# Patient Record
Sex: Male | Born: 1965 | Race: White | Hispanic: No | Marital: Single | Smoking: Never smoker
Health system: Southern US, Community
[De-identification: ages and names within clinical notes are randomized; demographics above are authoritative.]

## PROBLEM LIST (undated history)

## (undated) DIAGNOSIS — T7840XA Allergy, unspecified, initial encounter: Secondary | ICD-10-CM

## (undated) DIAGNOSIS — K529 Noninfective gastroenteritis and colitis, unspecified: Secondary | ICD-10-CM

## (undated) DIAGNOSIS — R634 Abnormal weight loss: Secondary | ICD-10-CM

## (undated) HISTORY — PX: ANKLE SURGERY: SHX546

## (undated) HISTORY — PX: KNEE SURGERY: SHX244

---

## 2016-05-02 ENCOUNTER — Encounter: Payer: Self-pay | Admitting: Emergency Medicine

## 2016-05-02 ENCOUNTER — Emergency Department
Admission: EM | Admit: 2016-05-02 | Discharge: 2016-05-02 | Disposition: A | Discharge status: Home / Self Care | Payer: BLUE CROSS/BLUE SHIELD | Attending: Emergency Medicine | Admitting: Emergency Medicine

## 2016-05-02 DIAGNOSIS — J0100 Acute maxillary sinusitis, unspecified: Secondary | ICD-10-CM | POA: Insufficient documentation

## 2016-05-02 DIAGNOSIS — H6993 Unspecified Eustachian tube disorder, bilateral: Secondary | ICD-10-CM | POA: Diagnosis not present

## 2016-05-02 DIAGNOSIS — R439 Unspecified disturbances of smell and taste: Secondary | ICD-10-CM | POA: Diagnosis not present

## 2016-05-02 DIAGNOSIS — R432 Parageusia: Secondary | ICD-10-CM

## 2016-05-02 DIAGNOSIS — H6983 Other specified disorders of Eustachian tube, bilateral: Secondary | ICD-10-CM

## 2016-05-02 DIAGNOSIS — H9203 Otalgia, bilateral: Secondary | ICD-10-CM | POA: Diagnosis present

## 2016-05-02 MED ORDER — PREDNISONE 10 MG (21) PO TBPK: ORAL_TABLET | ORAL | 0 refills | Status: AC

## 2016-05-02 MED ORDER — AMOXICILLIN 500 MG PO TABS: 500.0000 mg | ORAL_TABLET | Freq: Three times a day (TID) | ORAL | 0 refills | Status: AC

## 2016-05-02 NOTE — ED Notes (Signed)
First nurse note   Having bilateral ear pain for a couple of weeks  Feels like"ears are clogged"

## 2016-05-02 NOTE — ED Notes (Signed)
AAOx3.  Skin warm and dry.  NAD 

## 2016-05-02 NOTE — ED Provider Notes (Signed)
[facility] Emergency Department Provider Note ____________________________________________  Time seen: Approximately 9:31 AM  I have reviewed the triage vital signs and the nursing notes.   HISTORY  Chief Complaint Otalgia    HPI Angus SellerWilliam D Ware is a 51 y.o. male who presents to the emergency department for evaluation of sinus pressure and decreased hearing in both ears, "bumps" on tongue, and decreased taste sensation. Symptoms have been present for the past 2-3 weeks. He has a PMH of seasonal allergies. He is currently taking Sudafed without relief of current symptoms. He has been using a wood burning fireplace in his home recently, otherwise no new exposures.   History reviewed. No pertinent past medical history.  There are no active problems to display for this patient.   Past Surgical History:  Procedure Laterality Date  . ANKLE SURGERY    . KNEE SURGERY      Prior to Admission medications   Medication Sig Start Date End Date Taking? Authorizing Provider  amoxicillin (AMOXIL) 500 MG tablet Take 1 tablet (500 mg total) by mouth 3 (three) times daily. 05/02/16   Chinita Pesterari B Leonardo Makris, FNP  predniSONE (STERAPRED UNI-PAK 21 TAB) 10 MG (21) TBPK tablet Take 6 tablets on day 1 Take 5 tablets on day 2 Take 4 tablets on day 3 Take 3 tablets on day 4 Take 2 tablets on day 5 Take 1 tablet on day 6 05/02/16   Chinita Pesterari B Kae Lauman, FNP    Allergies Patient has no known allergies.  History reviewed. No pertinent family history.  Social History Social History  Substance Use Topics  . Smoking status: Never Smoker  . Smokeless tobacco: Never Used  . Alcohol use Yes     Comment: rare    Review of Systems Constitutional: Negative for fever/chills Eyes: No visual changes. ENT: Negative for  Earache. Positive for pressure feeling and decreased hearing bilaterally Gastrointestinal: No abdominal pain.  No nausea, no vomiting.  No diarrhea.  No  constipation. Musculoskeletal: Negative for pain. Skin: Negative for rash. Neurological: Negative for headaches, focal weakness or numbness. ____________________________________________   PHYSICAL EXAM:  VITAL SIGNS: ED Triage Vitals [05/02/16 0829]  Enc Vitals Group     BP 126/77     Pulse Rate (!) 59     Resp 16     Temp 98.1 F (36.7 C)     Temp Source Oral     SpO2 99 %     Weight 170 lb (77.1 kg)     Height 6\' 1"  (1.854 m)     Head Circumference      Peak Flow      Pain Score      Pain Loc      Pain Edu?      Excl. in GC?     Constitutional: Alert and oriented. Well appearing and in no acute distress. Eyes: Conjunctivae are normal. PERRL. EOMI. Ears: No pain with movement of either auricle:; External canal patent;  Right TM partially occluded by cerumen, visible TM retracted; Left TM partially occluded by cerumen, visible TM retracted.   Head: Atraumatic. Nose: No congestion/rhinnorhea. Tenderness on light tap over maxillary sinuses. Mouth/Throat: Mucous membranes are moist.  Oropharynx non-erythematous. Papillae enlarged on posterior tongue Hematological/Lymphatic/Immunilogical: No cervical lymphadenopathy. Cardiovascular: Normal capillary refill. Respiratory: Normal respiratory effort.  No retractions.  Neurologic:  Normal speech and language. No gross focal neurologic deficits are appreciated. Speech is normal. No gait instability. Skin:  Skin is warm, dry and intact. No rash  noted. ____________________________________________   LABS (all labs ordered are listed, but only abnormal results are displayed)  Labs Reviewed - No data to display ____________________________________________   RADIOLOGY  Not indicated ____________________________________________   PROCEDURES  Procedure(s) performed: None  ____________________________________________   INITIAL IMPRESSION / ASSESSMENT AND PLAN / ED COURSE  Pertinent labs & imaging results that were  available during my care of the patient were reviewed by me and considered in my medical decision making (see chart for details).    51 year old male presenting for evaluation of decreased hearing, loss of taste, bumps on back of tongue.He will be treated with prednisone and amoxicillin for eustachian tube dysfunction and sinusitis. He was advised to follow up with ENT for symptoms that are not improving over the week. He was advised to return to the ER for symptoms that change or worsen if unable to see primary care or the specialist. ____________________________________________   FINAL CLINICAL IMPRESSION(S) / ED DIAGNOSES  Final diagnoses:  Eustachian tube dysfunction, bilateral  Subacute maxillary sinusitis  Loss of taste    Note:  This document was prepared using Dragon voice recognition software and may include unintentional dictation errors.     Chinita Pester, FNP 05/02/16 1317    Minna Antis, MD 05/02/16 1355

## 2016-05-02 NOTE — Discharge Instructions (Signed)
Take your medications as prescribed and until finished. Follow up with the ENT specialist for symptoms that are not improving over the week. Return to the ER for symptoms that change or worsen if unable to schedule an appointment.

## 2016-05-02 NOTE — ED Triage Notes (Signed)
Pt presents with ear pain, sinus pain, and "bumps on back of tongue" x 2 weeks. Pt states he is having trouble hearing. NAD noted.

## 2017-02-14 ENCOUNTER — Emergency Department (HOSPITAL_COMMUNITY): Payer: BLUE CROSS/BLUE SHIELD

## 2017-02-14 ENCOUNTER — Emergency Department (HOSPITAL_COMMUNITY)
Admission: EM | Admit: 2017-02-14 | Discharge: 2017-02-14 | Disposition: A | Discharge status: Home / Self Care | Payer: BLUE CROSS/BLUE SHIELD | Attending: Emergency Medicine | Admitting: Emergency Medicine

## 2017-02-14 ENCOUNTER — Encounter (HOSPITAL_COMMUNITY): Payer: Self-pay | Admitting: *Deleted

## 2017-02-14 ENCOUNTER — Other Ambulatory Visit: Payer: Self-pay

## 2017-02-14 DIAGNOSIS — K529 Noninfective gastroenteritis and colitis, unspecified: Secondary | ICD-10-CM | POA: Insufficient documentation

## 2017-02-14 DIAGNOSIS — R1030 Lower abdominal pain, unspecified: Secondary | ICD-10-CM | POA: Diagnosis present

## 2017-02-14 LAB — CBC WITH DIFFERENTIAL/PLATELET
BASOS PCT: 0 %
Basophils Absolute: 0 10*3/uL (ref 0.0–0.1)
EOS ABS: 0.4 10*3/uL (ref 0.0–0.7)
EOS PCT: 6 %
HCT: 42.7 % (ref 39.0–52.0)
Hemoglobin: 14.3 g/dL (ref 13.0–17.0)
Lymphocytes Relative: 26 %
Lymphs Abs: 1.5 10*3/uL (ref 0.7–4.0)
MCH: 33.9 pg (ref 26.0–34.0)
MCHC: 33.5 g/dL (ref 30.0–36.0)
MCV: 101.2 fL — ABNORMAL HIGH (ref 78.0–100.0)
MONO ABS: 0.6 10*3/uL (ref 0.1–1.0)
Monocytes Relative: 10 %
Neutro Abs: 3.4 10*3/uL (ref 1.7–7.7)
Neutrophils Relative %: 58 %
Platelets: 178 10*3/uL (ref 150–400)
RBC: 4.22 MIL/uL (ref 4.22–5.81)
RDW: 13.8 % (ref 11.5–15.5)
WBC: 5.9 10*3/uL (ref 4.0–10.5)

## 2017-02-14 LAB — COMPREHENSIVE METABOLIC PANEL
ALBUMIN: 4.1 g/dL (ref 3.5–5.0)
ALT: 23 U/L (ref 17–63)
ANION GAP: 6 (ref 5–15)
AST: 27 U/L (ref 15–41)
Alkaline Phosphatase: 112 U/L (ref 38–126)
BUN: 18 mg/dL (ref 6–20)
CO2: 27 mmol/L (ref 22–32)
Calcium: 8.5 mg/dL — ABNORMAL LOW (ref 8.9–10.3)
Chloride: 103 mmol/L (ref 101–111)
Creatinine, Ser: 0.73 mg/dL (ref 0.61–1.24)
GFR calc non Af Amer: >60 mL/min (ref >60)
GLUCOSE: 92 mg/dL (ref 65–99)
POTASSIUM: 3.7 mmol/L (ref 3.5–5.1)
SODIUM: 136 mmol/L (ref 135–145)
TOTAL PROTEIN: 7.3 g/dL (ref 6.5–8.1)
Total Bilirubin: 0.5 mg/dL (ref 0.3–1.2)

## 2017-02-14 LAB — URINALYSIS, ROUTINE W REFLEX MICROSCOPIC
Bilirubin Urine: NEGATIVE
GLUCOSE, UA: NEGATIVE mg/dL
HGB URINE DIPSTICK: NEGATIVE
KETONES UR: NEGATIVE mg/dL
Leukocytes, UA: NEGATIVE
Nitrite: NEGATIVE
PROTEIN: NEGATIVE mg/dL
Specific Gravity, Urine: 1.026 (ref 1.005–1.030)
pH: 6 (ref 5.0–8.0)

## 2017-02-14 MED ORDER — CIPROFLOXACIN IN D5W 400 MG/200ML IV SOLN
400.0000 mg | Freq: Once | INTRAVENOUS | Status: AC
  Administered 2017-02-14: 400 mg via INTRAVENOUS
  Filled 2017-02-14: qty 200

## 2017-02-14 MED ORDER — ONDANSETRON 4 MG PO TBDP: 4.0000 mg | ORAL_TABLET | Freq: Three times a day (TID) | ORAL | 0 refills | Status: AC | PRN

## 2017-02-14 MED ORDER — CIPROFLOXACIN HCL 500 MG PO TABS: 500.0000 mg | ORAL_TABLET | Freq: Two times a day (BID) | ORAL | 0 refills | Status: AC

## 2017-02-14 MED ORDER — METRONIDAZOLE IN NACL 5-0.79 MG/ML-% IV SOLN
500.0000 mg | Freq: Once | INTRAVENOUS | Status: AC
  Administered 2017-02-14: 500 mg via INTRAVENOUS
  Filled 2017-02-14: qty 100

## 2017-02-14 MED ORDER — SODIUM CHLORIDE 0.9 % IV BOLUS (SEPSIS)
1000.0000 mL | Freq: Once | INTRAVENOUS | Status: AC
  Administered 2017-02-14: 1000 mL via INTRAVENOUS

## 2017-02-14 MED ORDER — METRONIDAZOLE 500 MG PO TABS: 500.0000 mg | ORAL_TABLET | Freq: Two times a day (BID) | ORAL | 0 refills | Status: AC

## 2017-02-14 MED ORDER — IOPAMIDOL (ISOVUE-300) INJECTION 61%
100.0000 mL | Freq: Once | INTRAVENOUS | Status: AC | PRN
  Administered 2017-02-14: 100 mL via INTRAVENOUS

## 2017-02-14 NOTE — Discharge Instructions (Signed)
Take the antibiotics as prescribed.  Follow-up with your doctor for recheck.  You should also have a CT scan of your chest to evaluate the lung nodule we found accidentally. this should be evaluated in 3 months to make sure it is not changing.. Return to the ED if you develop new or worsening symptoms.

## 2017-02-14 NOTE — ED Triage Notes (Signed)
Pt c/o lower abdominal pain and lower back pain x 3 days; pt denies any n/v or urinary symptoms

## 2017-02-14 NOTE — ED Provider Notes (Signed)
[facility] EMERGENCY DEPARTMENT Provider Note   CSN: [id number] Arrival date & time: 02/14/17  0155     History   Chief Complaint Chief Complaint  Patient presents with  . Abdominal Pain    HPI Thomas Ware is a 51 y.o. male.  Patient presents with a 3-day history of lower abdominal pain right behind his umbilicus that comes and goes.  Associate with pain across his low back as well.  He is concerned he could have a kidney stone.  He denies any nausea or vomiting.  Denies any pain with urination or dysuria.  He has never had a kidney stone in the past.  He states he had diarrhea for about a week 2 weeks ago and was treated for this by his PCP.  States this is improved after he was prescribed Imodium.  States he had normal blood work and urine test at that time. No previous abdominal surgeries.  No testicular pain.  No blood in the stool. Good P.o. intake and urine output.   The history is provided by the patient.  Abdominal Pain   Associated symptoms include diarrhea and nausea. Pertinent negatives include fever, vomiting, constipation, dysuria, hematuria, headaches, arthralgias and myalgias.    History reviewed. No pertinent past medical history.  There are no active problems to display for this patient.   Past Surgical History:  Procedure Laterality Date  . ANKLE SURGERY    . KNEE SURGERY         Home Medications    Prior to Admission medications   Medication Sig Start Date End Date Taking? Authorizing Provider  amoxicillin (AMOXIL) 500 MG tablet Take 1 tablet (500 mg total) by mouth 3 (three) times daily. 05/02/16   Triplett, Cari B, FNP  predniSONE (STERAPRED UNI-PAK 21 TAB) 10 MG (21) TBPK tablet Take 6 tablets on day 1 Take 5 tablets on day 2 Take 4 tablets on day 3 Take 3 tablets on day 4 Take 2 tablets on day 5 Take 1 tablet on day 6 05/02/16   Chinita Pester, FNP    Family History History reviewed. No pertinent family history.  Social  History Social History   Tobacco Use  . Smoking status: Never Smoker  . Smokeless tobacco: Never Used  Substance Use Topics  . Alcohol use: Yes    Comment: rare  . Drug use: Yes    Frequency: 2.0 times per week    Types: Marijuana     Allergies   Patient has no known allergies.   Review of Systems Review of Systems  Constitutional: Negative for activity change, appetite change and fever.  HENT: Negative for congestion and rhinorrhea.   Eyes: Negative for visual disturbance.  Respiratory: Negative for cough, chest tightness and shortness of breath.   Gastrointestinal: Positive for abdominal pain, diarrhea and nausea. Negative for constipation and vomiting.  Genitourinary: Negative for dysuria, hematuria and testicular pain.  Musculoskeletal: Negative for arthralgias and myalgias.  Skin: Negative for rash.  Neurological: Negative for dizziness, weakness and headaches.   all other systems are negative except as noted in the HPI and PMH.     Physical Exam Updated Vital Signs BP (!) 134/91   Pulse (!) 56   Temp 97.6 F (36.4 C) (Oral)   Resp 18   Ht 6\' 1"  (1.854 m)   Wt 72.6 kg (160 lb)   SpO2 97%   BMI 21.11 kg/m   Physical Exam  Constitutional: He is oriented to person, place, and  time. He appears well-developed and well-nourished. No distress.  HENT:  Head: Normocephalic and atraumatic.  Mouth/Throat: Oropharynx is clear and moist. No oropharyngeal exudate.  Eyes: Conjunctivae and EOM are normal. Pupils are equal, round, and reactive to light.  Neck: Normal range of motion. Neck supple.  No meningismus.  Cardiovascular: Normal rate, regular rhythm, normal heart sounds and intact distal pulses.  No murmur heard. Pulmonary/Chest: Effort normal and breath sounds normal. No respiratory distress.  Abdominal: Soft. There is tenderness. There is no rebound and no guarding.  Mild periumbilical tenderness  Genitourinary:  Genitourinary Comments: No testicular  tenderness  Musculoskeletal: Normal range of motion. He exhibits no edema or tenderness.  No CVAT  Neurological: He is alert and oriented to person, place, and time. No cranial nerve deficit. He exhibits normal muscle tone. Coordination normal.   5/5 strength throughout. CN 2-12 intact.Equal grip strength.   Skin: Skin is warm.  Psychiatric: He has a normal mood and affect. His behavior is normal.  Nursing note and vitals reviewed.    ED Treatments / Results  Labs (all labs ordered are listed, but only abnormal results are displayed) Labs Reviewed  URINALYSIS, ROUTINE W REFLEX MICROSCOPIC - Abnormal; Notable for the following components:      Result Value   Color, Urine STRAW (*)    All other components within normal limits  CBC WITH DIFFERENTIAL/PLATELET - Abnormal; Notable for the following components:   MCV 101.2 (*)    All other components within normal limits  COMPREHENSIVE METABOLIC PANEL - Abnormal; Notable for the following components:   Calcium 8.5 (*)    All other components within normal limits    EKG  EKG Interpretation None       Radiology Ct Abdomen Pelvis W Contrast  Result Date: 02/14/2017 CLINICAL DATA:  Acute onset of sharp lower abdominal pain, radiating to the lower back. Initial encounter. EXAM: CT ABDOMEN AND PELVIS WITH CONTRAST TECHNIQUE: Multidetector CT imaging of the abdomen and pelvis was performed using the standard protocol following bolus administration of intravenous contrast. CONTRAST:  100mL ISOVUE-300 IOPAMIDOL (ISOVUE-300) INJECTION 61% COMPARISON:  None. FINDINGS: Lower chest: There is question of a 1.1 cm pleural based nodule at the right lung base (image 13 of 94). The visualized portions of the mediastinum are unremarkable. Hepatobiliary: The liver is unremarkable in appearance. The gallbladder is unremarkable in appearance. The common bile duct remains normal in caliber. Pancreas: The pancreas is within normal limits. Spleen: The spleen  is unremarkable in appearance. Adrenals/Urinary Tract: The adrenal glands are unremarkable in appearance. The kidneys are within normal limits. There is no evidence of hydronephrosis. No renal or ureteral stones are identified. No perinephric stranding is seen. Stomach/Bowel: The stomach is unremarkable in appearance. The small bowel is within normal limits. The appendix is not visualized; there is no evidence for appendicitis. There is suggestion of mild wall thickening along the sigmoid colon, which may reflect an acute infectious or inflammatory process. There is vague retroperitoneal haziness adjacent to the abdominal aorta, of uncertain significance. This may reflect some degree of inflammation about venous vasculature. Vascular/Lymphatic: The abdominal aorta is unremarkable in appearance. The inferior vena cava is grossly unremarkable. No retroperitoneal lymphadenopathy is seen. No pelvic sidewall lymphadenopathy is identified. Mildly prominent mesenteric nodes are seen. Reproductive: The bladder is moderately distended and grossly unremarkable. The prostate remains normal in size. Other: No additional soft tissue abnormalities are seen. Musculoskeletal: No acute osseous abnormalities are identified. The visualized musculature is unremarkable in appearance.  IMPRESSION: 1. Suggestion of mild wall thickening along the sigmoid colon, which may reflect an acute infectious or inflammatory process. 2. Vague retroperitoneal haziness adjacent to the abdominal aorta, of uncertain significance. This may reflect some degree of inflammation about venous vasculature, possibly related to the sigmoid process. 3. Mildly prominent mesenteric nodes noted. 4. **An incidental finding of potential clinical significance has been found. Question of 1.1 cm pleural based nodule at the right lung base. Consider one of the following in 3 months for both low-risk and high-risk individuals: (a) repeat chest CT, (b) follow-up PET-CT, or  (c) tissue sampling. This recommendation follows the consensus statement: Guidelines for Management of Incidental Pulmonary Nodules Detected on CT Images: From the Fleischner Society 2017; Radiology 2017; 284:228-243.** Electronically Signed   By: Roanna RaiderJeffery  Chang M.D.   On: 02/14/2017 03:59    Procedures Procedures (including critical care time)  Medications Ordered in ED Medications - No data to display   Initial Impression / Assessment and Plan / ED Course  I have reviewed the triage vital signs and the nursing notes.  Pertinent labs & imaging results that were available during my care of the patient were reviewed by me and considered in my medical decision making (see chart for details).    Periumbilical pain with nausea and low back pain.  Diarrhea last week which resolved.  Patient appears comfortable.  Low suspicion for kidney stone.  Periumbilical abdominal pain, nausea and diarrhea, will obtain imaging to evaluate for appendicitis.  Appendix is not seen but no secondary signs of appendicitis.  There is inflammation of the sigmoid colon.  Will treat with antibiotics. Patient will be informed of lung nodule and need for follow-up.  Patient well-appearing and tolerating p.o.  Will be discharged on antibiotics.  Advised of need for follow-up for lung nodule.  Return precautions discussed.  Final Clinical Impressions(s) / ED Diagnoses   Final diagnoses:  Colitis    ED Discharge Orders    None       Deb Loudin, Jeannett SeniorStephen, MD 02/14/17 [phone]

## 2017-04-11 ENCOUNTER — Telehealth: Payer: Self-pay

## 2017-04-11 ENCOUNTER — Other Ambulatory Visit: Payer: Self-pay

## 2017-04-11 DIAGNOSIS — R634 Abnormal weight loss: Secondary | ICD-10-CM

## 2017-04-11 NOTE — Telephone Encounter (Signed)
Gastroenterology Pre-Procedure Review  Request Date: 05/16/17 Requesting Physician: Dr. Tobi BastosAnna  PATIENT REVIEW QUESTIONS: The patient responded to the following health history questions as indicated:    1. Are you having any GI issues? no 2. Do you have a personal history of Polyps? no 3. Do you have a family history of Colon Cancer or Polyps? no 4. Diabetes Mellitus? no 5. Joint replacements in the past 12 months?no 6. Major health problems in the past 3 months?yes (Colitis 02/14/17) 7. Any artificial heart valves, MVP, or defibrillator?no    MEDICATIONS & ALLERGIES:    Patient reports the following regarding taking any anticoagulation/antiplatelet therapy:   Plavix, Coumadin, Eliquis, Xarelto, Lovenox, Pradaxa, Brilinta, or Effient? no Aspirin? no  Patient confirms/reports the following medications:  Current Outpatient Medications  Medication Sig Dispense Refill  . amoxicillin (AMOXIL) 500 MG tablet Take 1 tablet (500 mg total) by mouth 3 (three) times daily. 30 tablet 0  . ciprofloxacin (CIPRO) 500 MG tablet Take 1 tablet (500 mg total) 2 (two) times daily by mouth. 20 tablet 0  . metroNIDAZOLE (FLAGYL) 500 MG tablet Take 1 tablet (500 mg total) 2 (two) times daily by mouth. 20 tablet 0  . ondansetron (ZOFRAN ODT) 4 MG disintegrating tablet Take 1 tablet (4 mg total) every 8 (eight) hours as needed by mouth for nausea or vomiting. 20 tablet 0  . predniSONE (STERAPRED UNI-PAK 21 TAB) 10 MG (21) TBPK tablet Take 6 tablets on day 1 Take 5 tablets on day 2 Take 4 tablets on day 3 Take 3 tablets on day 4 Take 2 tablets on day 5 Take 1 tablet on day 6 21 tablet 0   No current facility-administered medications for this visit.     Patient confirms/reports the following allergies:  No Known Allergies  No orders of the defined types were placed in this encounter.   AUTHORIZATION INFORMATION Primary Insurance: 1D#: Group #:  Secondary Insurance: 1D#: Group #:  SCHEDULE  INFORMATION: Date: 05/16/17 Time: Location:[facility]

## 2017-05-10 ENCOUNTER — Telehealth: Payer: Self-pay | Admitting: Gastroenterology

## 2017-05-10 NOTE — Telephone Encounter (Signed)
Patient has requested to cancel his colonoscopy due to his work schedule.  He will call back to reschedule at another time.

## 2017-05-10 NOTE — Telephone Encounter (Signed)
Patient needs to r/s his procedure on 05/16/17.

## 2017-05-16 ENCOUNTER — Ambulatory Visit: Admit: 2017-05-16 | Payer: BLUE CROSS/BLUE SHIELD | Admitting: Gastroenterology

## 2017-05-16 SURGERY — COLONOSCOPY WITH PROPOFOL
Anesthesia: General

## 2017-11-23 ENCOUNTER — Encounter: Payer: Self-pay | Admitting: Family Medicine

## 2017-12-08 ENCOUNTER — Encounter: Payer: Self-pay | Admitting: *Deleted

## 2017-12-08 NOTE — Progress Notes (Signed)
ada

## 2018-06-04 IMAGING — CT CT ABD-PELV W/ CM
2 of 4 series · 15 of 46 positions shown, 17 images · IV contrast (Isovue)
Comparison: None.

CLINICAL DATA: Acute onset of sharp lower abdominal pain, radiating
to the lower back. Initial encounter.

EXAM:
CT ABDOMEN AND PELVIS WITH CONTRAST
TECHNIQUE: Multidetector CT imaging of the abdomen and pelvis was performed
using the standard protocol following bolus administration of
intravenous contrast.
CONTRAST:  100mL YMNM1E-BEE IOPAMIDOL (YMNM1E-BEE) INJECTION 61%

[Series 2: axial st · axial · 0.69mm/px · z∈[+721,+1141]mm · 12 of 94 slices shown, 14 images]
[im 5/94  soft-tissue]
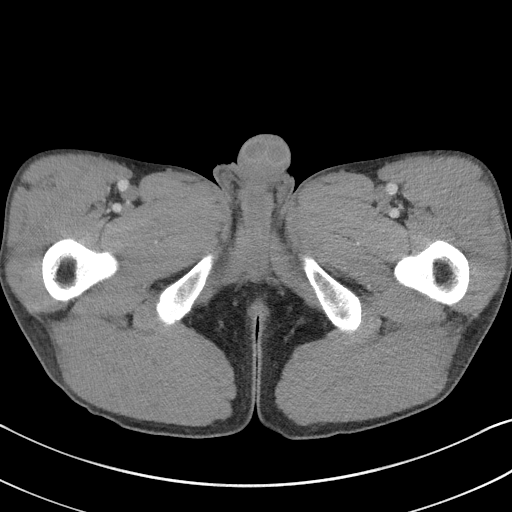
[im 5/94  bone]
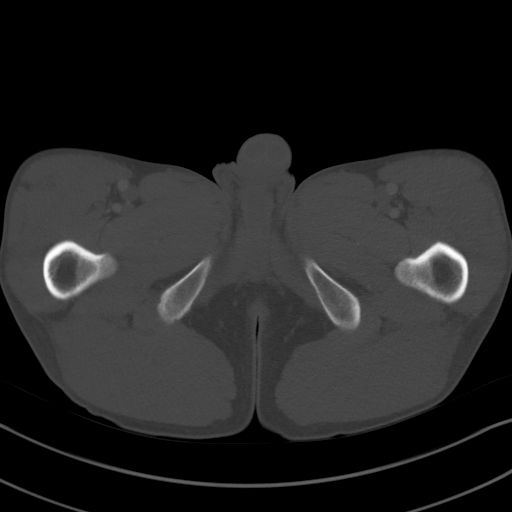
[im 13/94  soft-tissue]
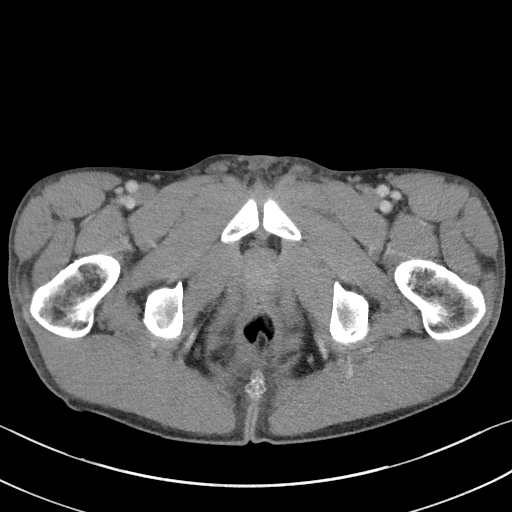
[im 21/94  soft-tissue]
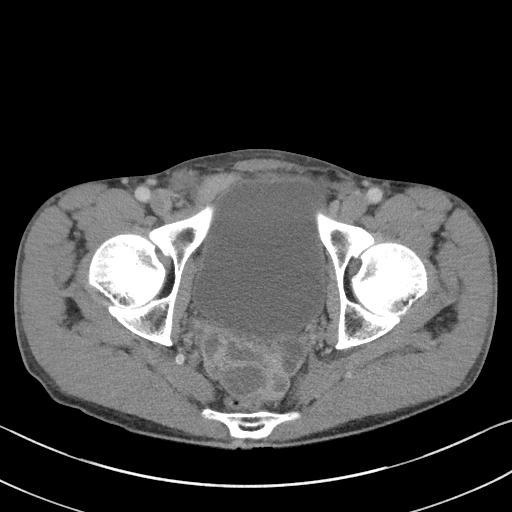
[im 29/94  soft-tissue]
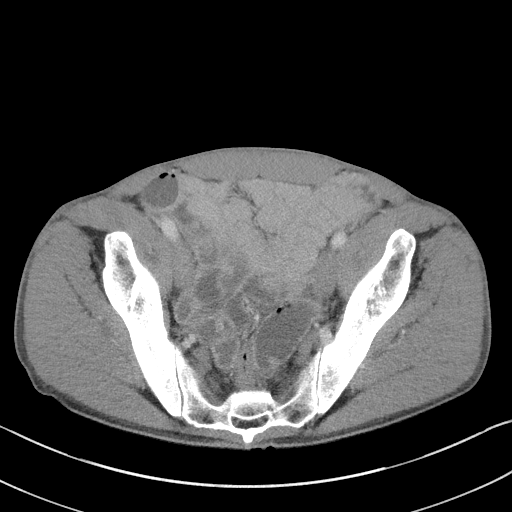
[im 37/94  soft-tissue]
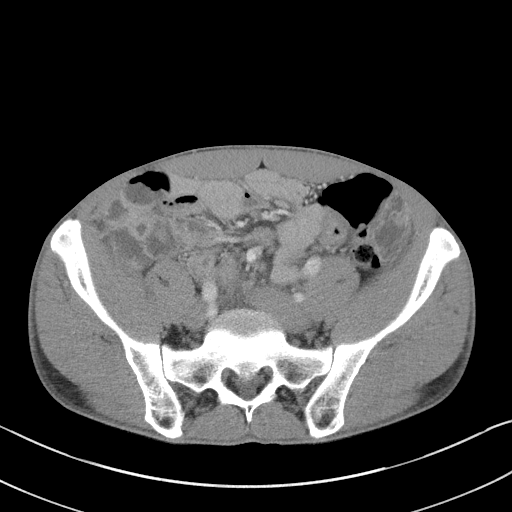
[im 45/94  soft-tissue]
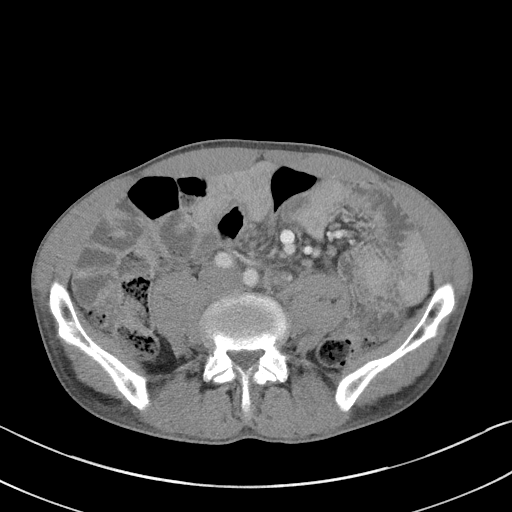
[im 49/94  soft-tissue]
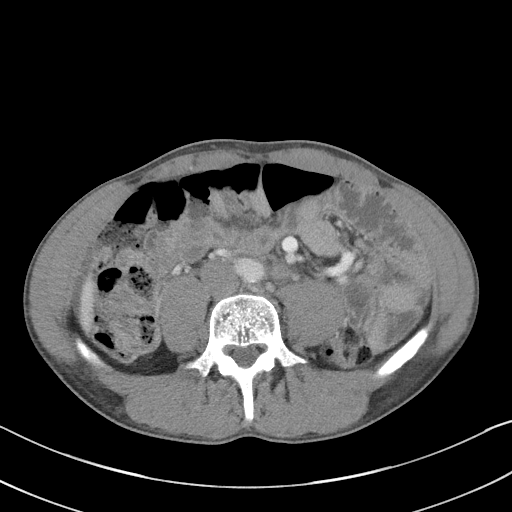
[im 57/94  soft-tissue]
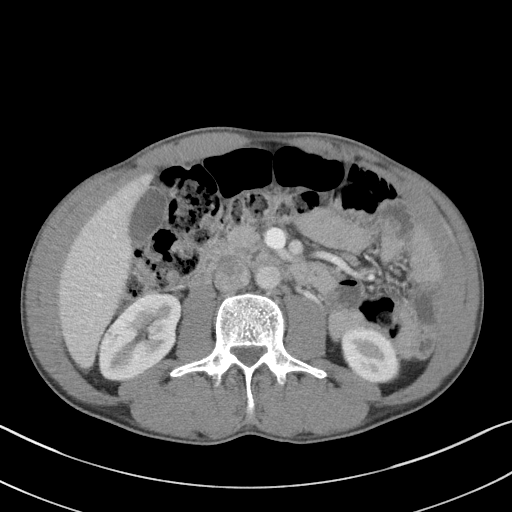
[im 65/94  soft-tissue]
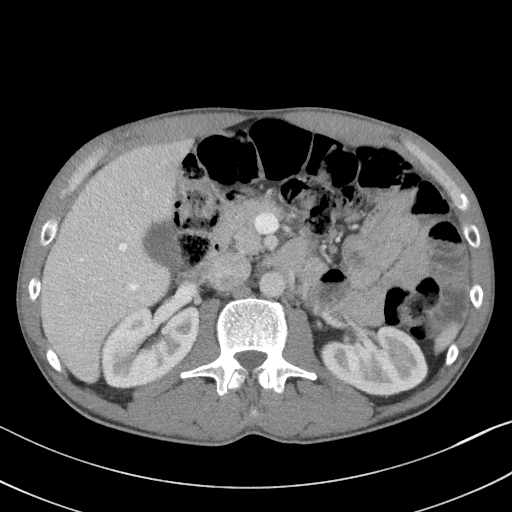
[im 65/94  bone]
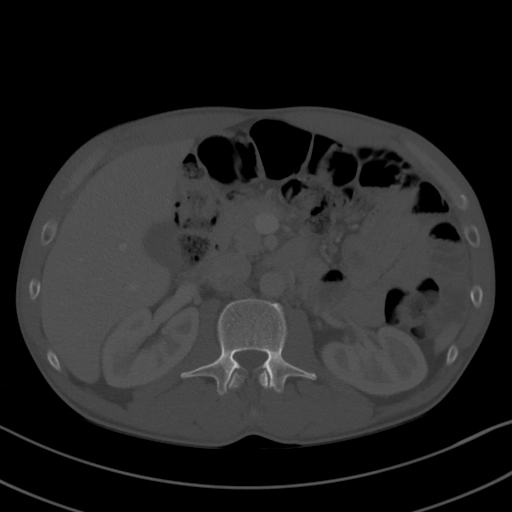
[im 73/94  soft-tissue]
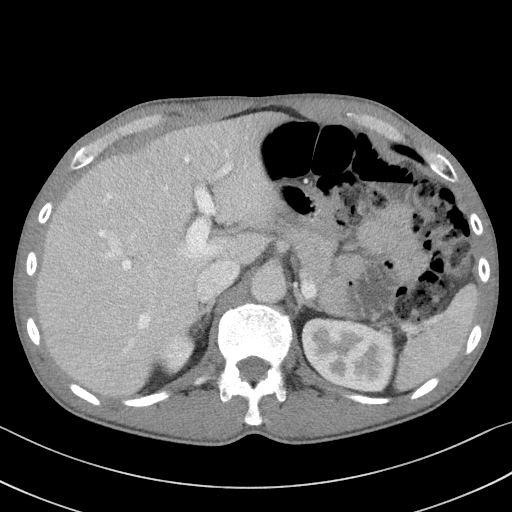
[im 81/94  soft-tissue]
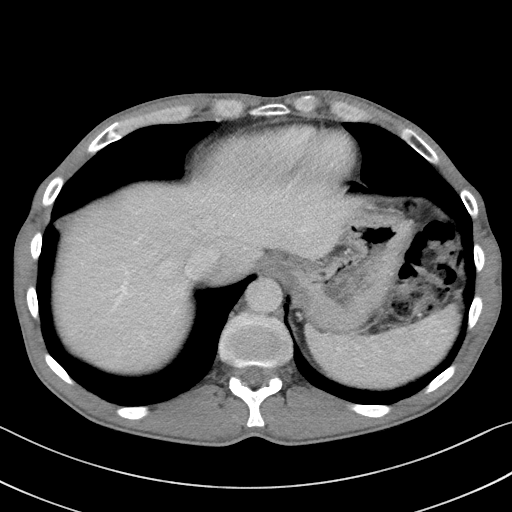
[im 89/94  soft-tissue]
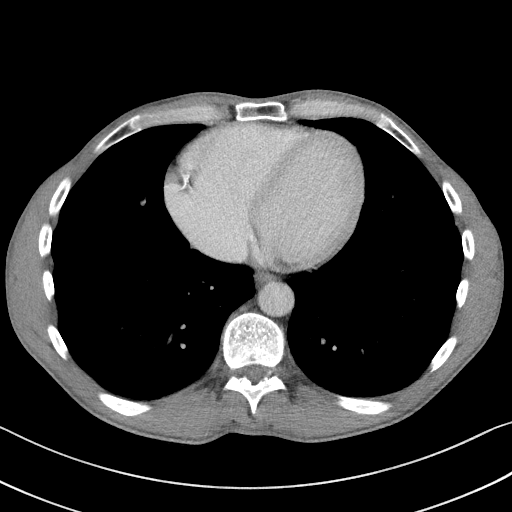

[Series 5: coronal st · coronal · 0.70mm/px · 3 of 95 slices shown]
[im 32/95  soft-tissue]
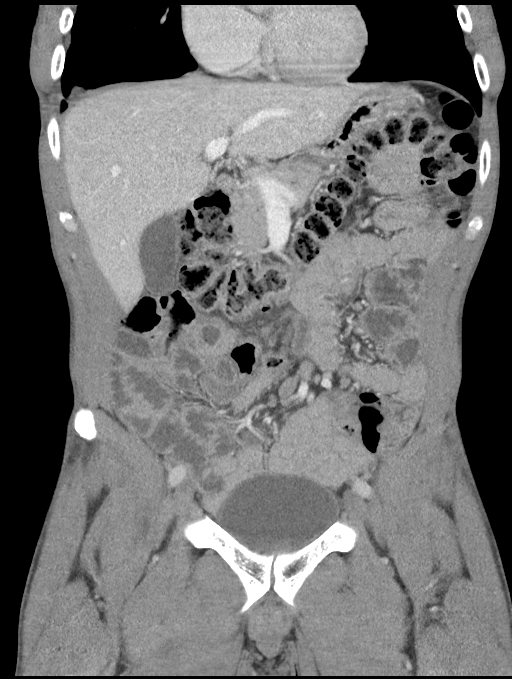
[im 42/95  soft-tissue]
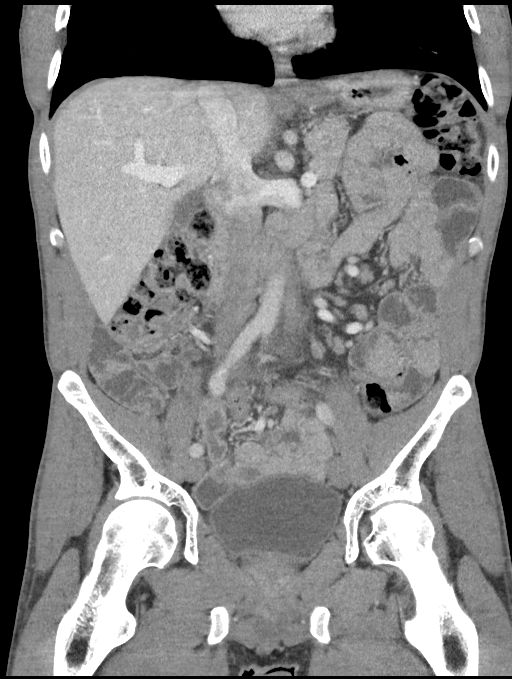
[im 53/95  soft-tissue]
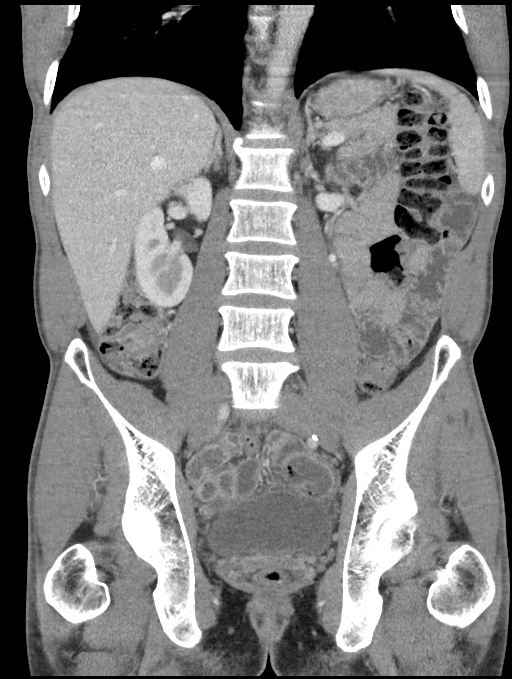

[15 of 46 positions shown; findings below may reference images not displayed]

FINDINGS: Lower chest: There is question of a 1.1 cm pleural based nodule at
the right lung base (image 13 of 94). The visualized portions of the
mediastinum are unremarkable.

Hepatobiliary: The liver is unremarkable in appearance. The
gallbladder is unremarkable in appearance. The common bile duct
remains normal in caliber.

Pancreas: The pancreas is within normal limits.

Spleen: The spleen is unremarkable in appearance.

Adrenals/Urinary Tract: The adrenal glands are unremarkable in
appearance. The kidneys are within normal limits. There is no
evidence of hydronephrosis. No renal or ureteral stones are
identified. No perinephric stranding is seen.

Stomach/Bowel: The stomach is unremarkable in appearance. The small
bowel is within normal limits. The appendix is not visualized; there
is no evidence for appendicitis.

There is suggestion of mild wall thickening along the sigmoid colon,
which may reflect an acute infectious or inflammatory process.

There is vague retroperitoneal haziness adjacent to the abdominal
aorta, of uncertain significance. This may reflect some degree of
inflammation about venous vasculature.

Vascular/Lymphatic: The abdominal aorta is unremarkable in
appearance. The inferior vena cava is grossly unremarkable. No
retroperitoneal lymphadenopathy is seen. No pelvic sidewall
lymphadenopathy is identified.

Mildly prominent mesenteric nodes are seen.

Reproductive: The bladder is moderately distended and grossly
unremarkable. The prostate remains normal in size.

Other: No additional soft tissue abnormalities are seen.

Musculoskeletal: No acute osseous abnormalities are identified. The
visualized musculature is unremarkable in appearance.
IMPRESSION: 1. Suggestion of mild wall thickening along the sigmoid colon, which
may reflect an acute infectious or inflammatory process.
2. Vague retroperitoneal haziness adjacent to the abdominal aorta,
of uncertain significance. This may reflect some degree of
inflammation about venous vasculature, possibly related to the
sigmoid process.
3. Mildly prominent mesenteric nodes noted.
4. **An incidental finding of potential clinical significance has
been found. Question of 1.1 cm pleural based nodule at the right
lung base. Consider one of the following in 3 months for both
low-risk and high-risk individuals: (a) repeat chest CT, (b)
follow-up PET-CT, or (c) tissue sampling. This recommendation
follows the consensus statement: Guidelines for Management of
Incidental Pulmonary Nodules Detected on CT Images: From the

## 2021-10-13 ENCOUNTER — Encounter: Payer: Self-pay | Admitting: Family Medicine

## 2022-03-02 ENCOUNTER — Ambulatory Visit (HOSPITAL_COMMUNITY)
Admission: RE | Admit: 2022-03-02 | Discharge: 2022-03-02 | Disposition: A | Discharge status: Home / Self Care | Payer: BLUE CROSS/BLUE SHIELD | Source: Ambulatory Visit | Attending: Family Medicine | Admitting: Family Medicine

## 2022-03-02 ENCOUNTER — Other Ambulatory Visit (HOSPITAL_COMMUNITY): Payer: Self-pay | Admitting: Family Medicine

## 2022-03-02 DIAGNOSIS — M542 Cervicalgia: Secondary | ICD-10-CM

## 2023-05-27 ENCOUNTER — Ambulatory Visit: Payer: Self-pay

## 2023-09-27 ENCOUNTER — Encounter: Payer: Self-pay | Admitting: Gastroenterology

## 2023-10-14 ENCOUNTER — Encounter: Payer: Self-pay | Admitting: Gastroenterology

## 2023-10-17 ENCOUNTER — Ambulatory Visit
Admission: RE | Admit: 2023-10-17 | Discharge: 2023-10-17 | Disposition: A | Discharge status: Home / Self Care | Payer: Self-pay | Attending: Gastroenterology | Admitting: Gastroenterology

## 2023-10-17 ENCOUNTER — Ambulatory Visit: Admitting: Anesthesiology

## 2023-10-17 ENCOUNTER — Encounter
Admission: RE | Disposition: A | Discharge status: Home / Self Care | Payer: Self-pay | Source: Home / Self Care | Attending: Gastroenterology

## 2023-10-17 ENCOUNTER — Encounter: Payer: Self-pay | Admitting: Gastroenterology

## 2023-10-17 DIAGNOSIS — D122 Benign neoplasm of ascending colon: Secondary | ICD-10-CM | POA: Insufficient documentation

## 2023-10-17 DIAGNOSIS — Z1211 Encounter for screening for malignant neoplasm of colon: Secondary | ICD-10-CM | POA: Insufficient documentation

## 2023-10-17 DIAGNOSIS — K640 First degree hemorrhoids: Secondary | ICD-10-CM | POA: Diagnosis not present

## 2023-10-17 HISTORY — DX: Noninfective gastroenteritis and colitis, unspecified: K52.9

## 2023-10-17 HISTORY — PX: POLYPECTOMY: SHX149

## 2023-10-17 HISTORY — DX: Allergy, unspecified, initial encounter: T78.40XA

## 2023-10-17 HISTORY — PX: COLONOSCOPY: SHX5424

## 2023-10-17 HISTORY — DX: Abnormal weight loss: R63.4

## 2023-10-17 SURGERY — COLONOSCOPY
Anesthesia: General

## 2023-10-17 MED ORDER — PHENYLEPHRINE HCL (PRESSORS) 10 MG/ML IV SOLN
INTRAVENOUS | Status: DC | PRN
  Administered 2023-10-17 (×2): 80 ug via INTRAVENOUS

## 2023-10-17 MED ORDER — LIDOCAINE HCL (CARDIAC) PF 100 MG/5ML IV SOSY
PREFILLED_SYRINGE | INTRAVENOUS | Status: DC | PRN
  Administered 2023-10-17: 60 mg via INTRAVENOUS

## 2023-10-17 MED ORDER — SODIUM CHLORIDE 0.9 % IV SOLN: INTRAVENOUS | Status: DC

## 2023-10-17 MED ORDER — PROPOFOL 500 MG/50ML IV EMUL
INTRAVENOUS | Status: DC | PRN
  Administered 2023-10-17: 150 ug/kg/min via INTRAVENOUS

## 2023-10-17 MED ORDER — DEXMEDETOMIDINE HCL IN NACL 80 MCG/20ML IV SOLN
INTRAVENOUS | Status: DC | PRN
  Administered 2023-10-17: 8 ug via INTRAVENOUS

## 2023-10-17 MED ORDER — PROPOFOL 10 MG/ML IV BOLUS
INTRAVENOUS | Status: DC | PRN
  Administered 2023-10-17: 80 mg via INTRAVENOUS
  Administered 2023-10-17: 20 mg via INTRAVENOUS

## 2023-10-17 MED ORDER — PROPOFOL 1000 MG/100ML IV EMUL
INTRAVENOUS | Status: AC
  Filled 2023-10-17: qty 100

## 2023-10-17 NOTE — Op Note (Signed)
 [facility] Gastroenterology Patient Name: Thomas Ware Procedure Date: 10/17/2023 2:50 PM MRN: [id number] Account #: [id number] Date of Birth: 11-22-1965 Admit Type: Outpatient Age: 58 Room: [facility] ENDO ROOM 1 Gender: Male Note Status: Finalized Instrument Name: Colonscope [id number] Procedure:             Colonoscopy Indications:           Screening for colorectal malignant neoplasm Providers:             Elspeth Ozell Jungling DO, DO Medicines:             Monitored Anesthesia Care Complications:         No immediate complications. Estimated blood loss:                         Minimal. Procedure:             Pre-Anesthesia Assessment:                        - Prior to the procedure, a History and Physical was                         performed, and patient medications and allergies were                         reviewed. The patient is competent. The risks and                         benefits of the procedure and the sedation options and                         risks were discussed with the patient. All questions                         were answered and informed consent was obtained.                         Patient identification and proposed procedure were                         verified by the physician, the nurse, the anesthetist                         and the technician in the endoscopy suite. Mental                         Status Examination: alert and oriented. Airway                         Examination: normal oropharyngeal airway and neck                         mobility. Respiratory Examination: clear to                         auscultation. CV Examination: RRR, no murmurs, no S3                         or S4. Prophylactic Antibiotics: The patient does not  require prophylactic antibiotics. Prior                         Anticoagulants: The patient has taken no anticoagulant                         or antiplatelet agents. ASA Grade  Assessment: II - A                         patient with mild systemic disease. After reviewing                         the risks and benefits, the patient was deemed in                         satisfactory condition to undergo the procedure. The                         anesthesia plan was to use monitored anesthesia care                         (MAC). Immediately prior to administration of                         medications, the patient was re-assessed for adequacy                         to receive sedatives. The heart rate, respiratory                         rate, oxygen saturations, blood pressure, adequacy of                         pulmonary ventilation, and response to care were                         monitored throughout the procedure. The physical                         status of the patient was re-assessed after the                         procedure.                        After obtaining informed consent, the colonoscope was                         passed under direct vision. Throughout the procedure,                         the patient's blood pressure, pulse, and oxygen                         saturations were monitored continuously. The                         Colonoscope was introduced through the anus and  advanced to the the terminal ileum, with                         identification of the appendiceal orifice and IC                         valve. The colonoscopy was somewhat difficult due to a                         redundant colon and significant looping. Successful                         completion of the procedure was aided by straightening                         and shortening the scope to obtain bowel loop                         reduction, using scope torsion, applying abdominal                         pressure and lavage. The patient tolerated the                         procedure well. The quality of the bowel preparation                          was evaluated using the BBPS [facility] Bowel Preparation                         Scale) with scores of: Right Colon = 2 (minor amount                         of residual staining, small fragments of stool and/or                         opaque liquid, but mucosa seen well), Transverse Colon                         = 2 (minor amount of residual staining, small                         fragments of stool and/or opaque liquid, but mucosa                         seen well) and Left Colon = 2 (minor amount of                         residual staining, small fragments of stool and/or                         opaque liquid, but mucosa seen well). The total BBPS                         score equals 6. The quality of the bowel preparation  was good. The terminal ileum, ileocecal valve,                         appendiceal orifice, and rectum were photographed. Findings:      The perianal and digital rectal examinations were normal. Pertinent       negatives include normal sphincter tone.      The terminal ileum appeared normal. Estimated blood loss: none.      A 1 to 2 mm polyp was found in the ascending colon. The polyp was       sessile. The polyp was removed with a jumbo cold forceps. Resection and       retrieval were complete. Estimated blood loss was minimal.      Non-bleeding internal hemorrhoids were found during retroflexion. The       hemorrhoids were Grade I (internal hemorrhoids that do not prolapse).       Estimated blood loss: none.      The exam was otherwise without abnormality on direct and retroflexion       views. Impression:            - The examined portion of the ileum was normal.                        - One 1 to 2 mm polyp in the ascending colon, removed                         with a jumbo cold forceps. Resected and retrieved.                        - Non-bleeding internal hemorrhoids.                        - The examination was otherwise normal  on direct and                         retroflexion views. Recommendation:        - Patient has a contact number available for                         emergencies. The signs and symptoms of potential                         delayed complications were discussed with the patient.                         Return to normal activities tomorrow. Written                         discharge instructions were provided to the patient.                        - Discharge patient to home.                        - Resume previous diet.                        - Continue present medications.                        -  Await pathology results.                        - Repeat colonoscopy for surveillance based on                         pathology results.                        - Return to referring physician as previously                         scheduled.                        - The findings and recommendations were discussed with                         the patient. Procedure Code(s):     --- Professional ---                        [id number], Colonoscopy, flexible; with biopsy, single or                         multiple Diagnosis Code(s):     --- Professional ---                        Z12.11, Encounter for screening for malignant neoplasm                         of colon                        K64.0, First degree hemorrhoids                        D12.2, Benign neoplasm of ascending colon CPT copyright 2022 [employer]. All rights reserved. The codes documented in this report are preliminary and upon coder review may  be revised to meet current compliance requirements. Attending Participation:      I personally performed the entire procedure. Elspeth Jungling, DO Elspeth Ozell Jungling DO, DO 10/17/2023 4:04:45 PM This report has been signed electronically. Number of Addenda: 0 Note Initiated On: 10/17/2023 2:50 PM Scope Withdrawal Time: 0 hours 37 minutes 44 seconds  Total Procedure Duration:  0 hours 47 minutes 49 seconds  Estimated Blood Loss:  Estimated blood loss was minimal.      [facility]

## 2023-10-17 NOTE — H&P (Signed)
 Pre-Procedure H&P   Patient ID: Thomas Ware is a 58 y.o. male.  Gastroenterology Provider: Elspeth Ozell Jungling, DO  Referring Provider: Jonette Primmer, PA PCP: Adina Buel HERO, MD  Date: 10/17/2023  HPI Mr. CLEARANCE Thomas Ware is a 58 y.o. male who presents today for Colonoscopy for Colorectal cancer screening .  Initial screening.  No family history of colon cancer or colon polyps.  Reports daily bowel movement without melena or hematochezia.  Occasionally sees bright red blood with wiping.  CT demonstrated colitis in 2018.  Was scheduled for colonoscopy but did not follow-up.  Creatinine 0.6 hemoglobin 13.4 MCV 97.5 platelets 213,000   Past Medical History:  Diagnosis Date   Allergy    Gastroenteritis    Weight loss     Past Surgical History:  Procedure Laterality Date   ANKLE SURGERY     KNEE SURGERY      Family History No h/o GI disease or malignancy  Review of Systems  Constitutional:  Negative for activity change, appetite change, chills, diaphoresis, fatigue, fever and unexpected weight change.  HENT:  Negative for trouble swallowing and voice change.   Respiratory:  Negative for shortness of breath and wheezing.   Cardiovascular:  Negative for chest pain, palpitations and leg swelling.  Gastrointestinal:  Positive for anal bleeding. Negative for abdominal distention, abdominal pain, blood in stool, constipation, diarrhea, nausea and vomiting.  Musculoskeletal:  Negative for arthralgias and myalgias.  Skin:  Negative for color change and pallor.  Neurological:  Negative for dizziness, syncope and weakness.  Psychiatric/Behavioral:  Negative for confusion. The patient is not nervous/anxious.   All other systems reviewed and are negative.    Medications No current facility-administered medications on file prior to encounter.   Current Outpatient Medications on File Prior to Encounter  Medication Sig Dispense Refill   amoxicillin  (AMOXIL ) 500 MG  tablet Take 1 tablet (500 mg total) by mouth 3 (three) times daily. (Patient not taking: Reported on 09/27/2023) 30 tablet 0   ciprofloxacin  (CIPRO ) 500 MG tablet Take 1 tablet (500 mg total) 2 (two) times daily by mouth. 20 tablet 0   metroNIDAZOLE  (FLAGYL ) 500 MG tablet Take 1 tablet (500 mg total) 2 (two) times daily by mouth. 20 tablet 0   ondansetron  (ZOFRAN  ODT) 4 MG disintegrating tablet Take 1 tablet (4 mg total) every 8 (eight) hours as needed by mouth for nausea or vomiting. 20 tablet 0   predniSONE  (STERAPRED UNI-PAK 21 TAB) 10 MG (21) TBPK tablet Take 6 tablets on day 1 Take 5 tablets on day 2 Take 4 tablets on day 3 Take 3 tablets on day 4 Take 2 tablets on day 5 Take 1 tablet on day 6 (Patient not taking: Reported on 09/27/2023) 21 tablet 0    Pertinent medications related to GI and procedure were reviewed by me with the patient prior to the procedure   Current Facility-Administered Medications:    0.9 %  sodium chloride  infusion, , Intravenous, Continuous, Jungling Elspeth Ozell, DO, Last Rate: 20 mL/hr at 10/17/23 1420, New Bag at 10/17/23 1420  sodium chloride  20 mL/hr at 10/17/23 1420       No Known Allergies Allergies were reviewed by me prior to the procedure  Objective   Body mass index is 21.51 kg/m. Vitals:   10/17/23 1402 10/17/23 1403 10/17/23 1404  BP:   128/84  Pulse:   (!) 57  Resp:   16  Temp:  (!) 96.9 F (36.1 C) (!) 96.8 F (  36 C)  TempSrc:  Temporal Temporal  SpO2:   100%  Weight: 73.9 kg    Height: 6' 1 (1.854 m)       Physical Exam Vitals and nursing note reviewed.  Constitutional:      General: He is not in acute distress.    Appearance: Normal appearance. He is not ill-appearing, toxic-appearing or diaphoretic.  HENT:     Head: Normocephalic and atraumatic.     Nose: Nose normal.     Mouth/Throat:     Mouth: Mucous membranes are moist.     Pharynx: Oropharynx is clear.  Eyes:     General: No scleral icterus.    Extraocular  Movements: Extraocular movements intact.  Cardiovascular:     Rate and Rhythm: Normal rate and regular rhythm.     Heart sounds: Normal heart sounds. No murmur heard.    No friction rub. No gallop.  Pulmonary:     Effort: Pulmonary effort is normal. No respiratory distress.     Breath sounds: Normal breath sounds. No wheezing, rhonchi or rales.  Abdominal:     General: Bowel sounds are normal. There is no distension.     Palpations: Abdomen is soft.     Tenderness: There is no abdominal tenderness. There is no guarding or rebound.  Musculoskeletal:     Cervical back: Neck supple.     Right lower leg: No edema.     Left lower leg: No edema.  Skin:    General: Skin is warm and dry.     Coloration: Skin is not jaundiced or pale.  Neurological:     General: No focal deficit present.     Mental Status: He is alert and oriented to person, place, and time. Mental status is at baseline.  Psychiatric:        Mood and Affect: Mood normal.        Behavior: Behavior normal.        Thought Content: Thought content normal.        Judgment: Judgment normal.      Assessment:  Mr. Thomas Ware is a 58 y.o. male  who presents today for Colonoscopy for Colorectal cancer screening .  Plan:  Colonoscopy with possible intervention today  Colonoscopy with possible biopsy, control of bleeding, polypectomy, and interventions as necessary has been discussed with the patient/patient representative. Informed consent was obtained from the patient/patient representative after explaining the indication, nature, and risks of the procedure including but not limited to death, bleeding, perforation, missed neoplasm/lesions, cardiorespiratory compromise, and reaction to medications. Opportunity for questions was given and appropriate answers were provided. Patient/patient representative has verbalized understanding is amenable to undergoing the procedure.   Elspeth Ozell Jungling, DO  [facility]  Gastroenterology  Portions of the record may have been created with voice recognition software. Occasional wrong-word or 'sound-a-like' substitutions may have occurred due to the inherent limitations of voice recognition software.  Read the chart carefully and recognize, using context, where substitutions may have occurred.

## 2023-10-17 NOTE — Transfer of Care (Signed)
 Immediate Anesthesia Transfer of Care Note  Patient: Thomas Ware  Procedure(s) Performed: COLONOSCOPY POLYPECTOMY, INTESTINE  Patient Location: Endoscopy Unit  Anesthesia Type:General  Level of Consciousness: sedated and drowsy  Airway & Oxygen Therapy: Patient Spontanous Breathing  Post-op Assessment: Report given to RN and Post -op Vital signs reviewed and stable  Post vital signs: Reviewed and stable  Last Vitals:  Vitals Value Taken Time  BP 99/64 10/17/23 16:00  Temp 35.8 C 10/17/23 16:00  Pulse 58 10/17/23 16:01  Resp 24 10/17/23 16:01  SpO2 97 % 10/17/23 16:01    Last Pain:  Vitals:   10/17/23 1600  TempSrc: Temporal         Complications: No notable events documented.

## 2023-10-17 NOTE — Interval H&P Note (Signed)
 History and Physical Interval Note: Preprocedure H&P from 10/17/23  was reviewed and there was no interval change after seeing and examining the patient.  Written consent was obtained from the patient after discussion of risks, benefits, and alternatives. Patient has consented to proceed with Colonoscopy with possible intervention   10/17/2023 2:51 PM  Thomas Ware  has presented today for surgery, with the diagnosis of Colon cancer screening (Z12.11).  The various methods of treatment have been discussed with the patient and family. After consideration of risks, benefits and other options for treatment, the patient has consented to  Procedure(s): COLONOSCOPY (N/A) as a surgical intervention.  The patient's history has been reviewed, patient examined, no change in status, stable for surgery.  I have reviewed the patient's chart and labs.  Questions were answered to the patient's satisfaction.     Elspeth Ozell Jungling

## 2023-10-17 NOTE — Anesthesia Preprocedure Evaluation (Signed)
 Anesthesia Evaluation  Patient identified by MRN, date of birth, ID band Patient awake    Reviewed: Allergy & Precautions, H&P , NPO status , Patient's Chart, lab work & pertinent test results, reviewed documented beta blocker date and time   History of Anesthesia Complications Negative for: history of anesthetic complications  Airway Mallampati: I  TM Distance: >3 FB Neck ROM: full    Dental  (+) Dental Advidsory Given, Caps, Teeth Intact   Pulmonary neg pulmonary ROS   Pulmonary exam normal breath sounds clear to auscultation       Cardiovascular Exercise Tolerance: Good negative cardio ROS Normal cardiovascular exam Rhythm:regular Rate:Normal     Neuro/Psych negative neurological ROS  negative psych ROS   GI/Hepatic negative GI ROS, Neg liver ROS,,,  Endo/Other  negative endocrine ROS    Renal/GU negative Renal ROS  negative genitourinary   Musculoskeletal   Abdominal   Peds  Hematology negative hematology ROS (+)   Anesthesia Other Findings Past Medical History: No date: Allergy No date: Gastroenteritis No date: Weight loss   Reproductive/Obstetrics negative OB ROS                              Anesthesia Physical Anesthesia Plan  ASA: 1  Anesthesia Plan: General   Post-op Pain Management:    Induction: Intravenous  PONV Risk Score and Plan: 2 and Propofol  infusion and TIVA  Airway Management Planned: Natural Airway and Nasal Cannula  Additional Equipment:   Intra-op Plan:   Post-operative Plan:   Informed Consent: I have reviewed the patients History and Physical, chart, labs and discussed the procedure including the risks, benefits and alternatives for the proposed anesthesia with the patient or authorized representative who has indicated his/her understanding and acceptance.     Dental Advisory Given  Plan Discussed with: Anesthesiologist, CRNA and  Surgeon  Anesthesia Plan Comments:         Anesthesia Quick Evaluation

## 2023-10-18 NOTE — Anesthesia Postprocedure Evaluation (Signed)
 Anesthesia Post Note  Patient: Thomas Ware  Procedure(s) Performed: COLONOSCOPY POLYPECTOMY, INTESTINE  Patient location during evaluation: Endoscopy Anesthesia Type: General Level of consciousness: awake and alert Pain management: pain level controlled Vital Signs Assessment: post-procedure vital signs reviewed and stable Respiratory status: spontaneous breathing, nonlabored ventilation, respiratory function stable and patient connected to nasal cannula oxygen Cardiovascular status: blood pressure returned to baseline and stable Postop Assessment: no apparent nausea or vomiting Anesthetic complications: no   No notable events documented.   Last Vitals:  Vitals:   10/17/23 1610 10/17/23 1620  BP: (!) 101/56 114/72  Pulse: (!) 58 (!) 50  Resp: 14 13  Temp:    SpO2: 100% 100%    Last Pain:  Vitals:   10/17/23 1620  TempSrc:   PainSc: 0-No pain                 Prentice Murphy

## 2023-10-19 LAB — SURGICAL PATHOLOGY
